# Patient Record
Sex: Male | Born: 1970 | Race: White | Hispanic: No | Marital: Single | Smoking: Current every day smoker
Health system: Southern US, Community
[De-identification: ages and names within clinical notes are randomized; demographics above are authoritative.]

## PROBLEM LIST (undated history)

## (undated) DIAGNOSIS — F1910 Other psychoactive substance abuse, uncomplicated: Secondary | ICD-10-CM

## (undated) DIAGNOSIS — I10 Essential (primary) hypertension: Secondary | ICD-10-CM

## (undated) DIAGNOSIS — E785 Hyperlipidemia, unspecified: Secondary | ICD-10-CM

## (undated) HISTORY — PX: OTHER SURGICAL HISTORY: SHX169

## (undated) HISTORY — DX: Hyperlipidemia, unspecified: E78.5

## (undated) HISTORY — DX: Essential (primary) hypertension: I10

## (undated) HISTORY — DX: Other psychoactive substance abuse, uncomplicated: F19.10

---

## 2013-02-04 ENCOUNTER — Telehealth: Payer: Self-pay | Admitting: Family Medicine

## 2013-02-04 NOTE — Telephone Encounter (Signed)
APPT MADE

## 2013-02-05 ENCOUNTER — Encounter: Payer: Self-pay | Admitting: General Practice

## 2013-02-05 ENCOUNTER — Ambulatory Visit (INDEPENDENT_AMBULATORY_CARE_PROVIDER_SITE_OTHER): Payer: 59 | Admitting: General Practice

## 2013-02-05 VITALS — BP 147/92 | HR 71 | Temp 96.8°F | Ht 70.0 in | Wt 254.0 lb

## 2013-02-05 DIAGNOSIS — J069 Acute upper respiratory infection, unspecified: Secondary | ICD-10-CM

## 2013-02-05 DIAGNOSIS — R062 Wheezing: Secondary | ICD-10-CM

## 2013-02-05 DIAGNOSIS — R0602 Shortness of breath: Secondary | ICD-10-CM

## 2013-02-05 MED ORDER — ALBUTEROL SULFATE HFA 108 (90 BASE) MCG/ACT IN AERS: 2.0000 | INHALATION_SPRAY | Freq: Four times a day (QID) | RESPIRATORY_TRACT | Status: DC | PRN

## 2013-02-05 MED ORDER — AZITHROMYCIN 250 MG PO TABS: ORAL_TABLET | ORAL | Status: DC

## 2013-02-05 NOTE — Patient Instructions (Addendum)

## 2013-02-05 NOTE — Progress Notes (Signed)
  Subjective:    Patient ID: Cody Warner, male    DOB: May 01, 1971, 42 y.o.   MRN: [id number]  HPI Patient presents today with complaints of coughing, congestion, and wheezing. Reports onset of symptoms was three weeks ago. Reports symptoms got better, but then worsened. Reports taking OTC mucinex, nyquil, and delsym with minimal relief.     Review of Systems  Constitutional: Negative for fever and chills.  Respiratory: Positive for cough, shortness of breath and wheezing. Negative for chest tightness.   Cardiovascular: Negative for chest pain and palpitations.  Neurological: Negative for dizziness, weakness and headaches.       Objective:   Physical Exam  Constitutional: He is oriented to person, place, and time. He appears well-developed and well-nourished.  HENT:  Head: Normocephalic and atraumatic.  Right Ear: External ear normal.  Left Ear: External ear normal.  Mouth/Throat: Posterior oropharyngeal erythema present.  Cardiovascular: Normal rate, regular rhythm and normal heart sounds.   Pulmonary/Chest: Effort normal. He has wheezes. He exhibits no tenderness.  Neurological: He is alert and oriented to person, place, and time.  Skin: Skin is warm and dry.  Psychiatric: He has a normal mood and affect.          Assessment & Plan:  1. Acute upper respiratory infections of unspecified site - azithromycin (ZITHROMAX) 250 MG tablet; Take as directed  Dispense: 6 tablet; Refill: 0  2. Wheezing - albuterol (PROVENTIL HFA;VENTOLIN HFA) 108 (90 BASE) MCG/ACT inhaler; Inhale 2 puffs into the lungs every 6 (six) hours as needed for wheezing.  Dispense: 1 Inhaler; Refill: 0  3. Shortness of breath - albuterol (PROVENTIL HFA;VENTOLIN HFA) 108 (90 BASE) MCG/ACT inhaler; Inhale 2 puffs into the lungs every 6 (six) hours as needed for wheezing.  Dispense: 1 Inhaler; Refill: 0 -Increase fluid intake Motrin or tylenol OTC Proper handwashing Patient verbalized understanding Coralie Keens, FNP-C

## 2013-04-08 ENCOUNTER — Ambulatory Visit (INDEPENDENT_AMBULATORY_CARE_PROVIDER_SITE_OTHER): Payer: 59 | Admitting: Family Medicine

## 2013-04-08 ENCOUNTER — Encounter: Payer: Self-pay | Admitting: Family Medicine

## 2013-04-08 VITALS — BP 131/83 | HR 89 | Temp 97.2°F | Ht 70.0 in | Wt 253.0 lb

## 2013-04-08 DIAGNOSIS — G4700 Insomnia, unspecified: Secondary | ICD-10-CM

## 2013-04-08 LAB — POCT CBC
Granulocyte percent: 70.5 % (ref 37–80)
HCT, POC: 47.7 % (ref 43.5–53.7)
Hemoglobin: 15.4 g/dL (ref 14.1–18.1)
Lymph, poc: 2.5 (ref 0.6–3.4)
MCH, POC: 27.5 pg (ref 27–31.2)
MCHC: 32.4 g/dL (ref 31.8–35.4)
MCV: 85.1 fL (ref 80–97)
MPV: 8.2 fL (ref 0–99.8)
POC Granulocyte: 6.9 (ref 2–6.9)
POC LYMPH PERCENT: 25.8 % (ref 10–50)
Platelet Count, POC: 380 K/uL (ref 142–424)
RBC: 5.6 M/uL (ref 4.69–6.13)
RDW, POC: 13.2 %
WBC: 9.8 K/uL (ref 4.6–10.2)

## 2013-04-08 MED ORDER — TRAZODONE HCL 50 MG PO TABS: 25.0000 mg | ORAL_TABLET | Freq: Every evening | ORAL | Status: DC | PRN

## 2013-04-08 NOTE — Addendum Note (Signed)
Addended by: Orma Render F on: 04/08/2013 02:22 PM   Modules accepted: Orders

## 2013-04-08 NOTE — Patient Instructions (Signed)
Smoking Cessation Quitting smoking is important to your health and has many advantages. However, it is not always easy to quit since nicotine is a very addictive drug. Often times, people try 3 times or more before being able to quit. This document explains the best ways for you to prepare to quit smoking. Quitting takes hard work and a lot of effort, but you can do it. ADVANTAGES OF QUITTING SMOKING  You will live longer, feel better, and live better.  Your body will feel the impact of quitting smoking almost immediately.  Within 20 minutes, blood pressure decreases. Your pulse returns to its normal level.  After 8 hours, carbon monoxide levels in the blood return to normal. Your oxygen level increases.  After 24 hours, the chance of having a heart attack starts to decrease. Your breath, hair, and body stop smelling like smoke.  After 48 hours, damaged nerve endings begin to recover. Your sense of taste and smell improve.  After 72 hours, the body is virtually free of nicotine. Your bronchial tubes relax and breathing becomes easier.  After 2 to 12 weeks, lungs can hold more air. Exercise becomes easier and circulation improves.  The risk of having a heart attack, stroke, cancer, or lung disease is greatly reduced.  After 1 year, the risk of coronary heart disease is cut in half.  After 5 years, the risk of stroke falls to the same as a nonsmoker.  After 10 years, the risk of lung cancer is cut in half and the risk of other cancers decreases significantly.  After 15 years, the risk of coronary heart disease drops, usually to the level of a nonsmoker.  If you are pregnant, quitting smoking will improve your chances of having a healthy baby.  The people you live with, especially any children, will be healthier.  You will have extra money to spend on things other than cigarettes. QUESTIONS TO THINK ABOUT BEFORE ATTEMPTING TO QUIT You may want to talk about your answers with your  caregiver.  Why do you want to quit?  If you tried to quit in the past, what helped and what did not?  What will be the most difficult situations for you after you quit? How will you plan to handle them?  Who can help you through the tough times? Your family? Friends? A caregiver?  What pleasures do you get from smoking? What ways can you still get pleasure if you quit? Here are some questions to ask your caregiver:  How can you help me to be successful at quitting?  What medicine do you think would be best for me and how should I take it?  What should I do if I need more help?  What is smoking withdrawal like? How can I get information on withdrawal? GET READY  Set a quit date.  Change your environment by getting rid of all cigarettes, ashtrays, matches, and lighters in your home, car, or work. Do not let people smoke in your home.  Review your past attempts to quit. Think about what worked and what did not. GET SUPPORT AND ENCOURAGEMENT You have a better chance of being successful if you have help. You can get support in many ways.  Tell your family, friends, and co-workers that you are going to quit and need their support. Ask them not to smoke around you.  Get individual, group, or telephone counseling and support. Programs are available at local hospitals and health centers. Call your local health department for   information about programs in your area.  Spiritual beliefs and practices may help some smokers quit.  Download a "quit meter" on your computer to keep track of quit statistics, such as how long you have gone without smoking, cigarettes not smoked, and money saved.  Get a self-help book about quitting smoking and staying off of tobacco. LEARN NEW SKILLS AND BEHAVIORS  Distract yourself from urges to smoke. Talk to someone, go for a walk, or occupy your time with a task.  Change your normal routine. Take a different route to work. Drink tea instead of coffee.  Eat breakfast in a different place.  Reduce your stress. Take a hot bath, exercise, or read a book.  Plan something enjoyable to do every day. Reward yourself for not smoking.  Explore interactive web-based programs that specialize in helping you quit. GET MEDICINE AND USE IT CORRECTLY Medicines can help you stop smoking and decrease the urge to smoke. Combining medicine with the above behavioral methods and support can greatly increase your chances of successfully quitting smoking.  Nicotine replacement therapy helps deliver nicotine to your body without the negative effects and risks of smoking. Nicotine replacement therapy includes nicotine gum, lozenges, inhalers, nasal sprays, and skin patches. Some may be available over-the-counter and others require a prescription.  Antidepressant medicine helps people abstain from smoking, but how this works is unknown. This medicine is available by prescription.  Nicotinic receptor partial agonist medicine simulates the effect of nicotine in your brain. This medicine is available by prescription. Ask your caregiver for advice about which medicines to use and how to use them based on your health history. Your caregiver will tell you what side effects to look out for if you choose to be on a medicine or therapy. Carefully read the information on the package. Do not use any other product containing nicotine while using a nicotine replacement product.  RELAPSE OR DIFFICULT SITUATIONS Most relapses occur within the first 3 months after quitting. Do not be discouraged if you start smoking again. Remember, most people try several times before finally quitting. You may have symptoms of withdrawal because your body is used to nicotine. You may crave cigarettes, be irritable, feel very hungry, cough often, get headaches, or have difficulty concentrating. The withdrawal symptoms are only temporary. They are strongest when you first quit, but they will go away within  10 14 days. To reduce the chances of relapse, try to:  Avoid drinking alcohol. Drinking lowers your chances of successfully quitting.  Reduce the amount of caffeine you consume. Once you quit smoking, the amount of caffeine in your body increases and can give you symptoms, such as a rapid heartbeat, sweating, and anxiety.  Avoid smokers because they can make you want to smoke.  Do not let weight gain distract you. Many smokers will gain weight when they quit, usually less than 10 pounds. Eat a healthy diet and stay active. You can always lose the weight gained after you quit.  Find ways to improve your mood other than smoking. FOR MORE INFORMATION  www.smokefree.gov  Document Released: 04/25/2001 Document Revised: 10/31/2011 Document Reviewed: 08/10/2011 [employer] Patient Information 2014 [employer].  
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
## 2013-04-09 LAB — COMPREHENSIVE METABOLIC PANEL
Albumin/Globulin Ratio: 1.6 (ref 1.1–2.5)
Albumin: 4.3 g/dL (ref 3.5–5.5)
Alkaline Phosphatase: 84 IU/L (ref 39–117)
BUN/Creatinine Ratio: 13 (ref 9–20)
BUN: 10 mg/dL (ref 6–24)
CO2: 25 mmol/L (ref 18–29)
Chloride: 97 mmol/L (ref 97–108)
GFR calc Af Amer: 129 mL/min/{1.73_m2} (ref >59)
GFR calc non Af Amer: 111 mL/min/{1.73_m2} (ref >59)
Glucose: 128 mg/dL — ABNORMAL HIGH (ref 65–99)
Potassium: 3.6 mmol/L (ref 3.5–5.2)
Total Bilirubin: <0.2 mg/dL (ref 0.0–1.2)
Total Protein: 7 g/dL (ref 6.0–8.5)

## 2013-04-09 LAB — TSH: TSH: 2.89 u[IU]/mL (ref 0.450–4.500)

## 2013-04-15 ENCOUNTER — Telehealth: Payer: Self-pay | Admitting: *Deleted

## 2013-04-15 DIAGNOSIS — R739 Hyperglycemia, unspecified: Secondary | ICD-10-CM

## 2013-04-15 NOTE — Telephone Encounter (Signed)
Discussed with patient. He will return for A1C this week.  Order placed.

## 2013-04-15 NOTE — Telephone Encounter (Signed)
Message copied by Gwenith Daily on Tue Apr 15, 2013  3:43 PM ------      Message from: Floydene Flock      Created: Sat Apr 12, 2013  9:19 AM       Labs look good apart from glucose.       Will need follow up A1C.       Please contact pt to set this up.        ------

## 2013-04-19 LAB — PRESCRIPTION ABUSE MONITORING 17P, URINE
Amphetamine Screen, Urine: NEGATIVE ng/mL
Benzodiazepines: NEGATIVE ng/mL
Buprenorphine, Urine: NEGATIVE ng/mL
Cannabinoid: NEGATIVE ng/mL
Carisoprodol/Meprobamate, Ur: NEGATIVE ng/mL
Creatinine, Urine: 43 mg/dL (ref 20.0–300.0)
Methadone: NEGATIVE ng/mL
Opiate: NEGATIVE ng/mL
Oxycodone+Oxymorphone Ur Ql Scn: NEGATIVE ng/mL
Tramadol: NEGATIVE ng/mL
Zolpidem (Ambien), Urine: NEGATIVE ng/mL

## 2013-04-24 ENCOUNTER — Other Ambulatory Visit (INDEPENDENT_AMBULATORY_CARE_PROVIDER_SITE_OTHER): Payer: 59

## 2013-04-24 DIAGNOSIS — R7309 Other abnormal glucose: Secondary | ICD-10-CM

## 2013-04-24 LAB — POCT GLYCOSYLATED HEMOGLOBIN (HGB A1C): Hemoglobin A1C: 5.6

## 2013-04-24 NOTE — Addendum Note (Signed)
Addended by: Orma Render F on: 04/24/2013 11:31 AM   Modules accepted: Orders

## 2013-04-24 NOTE — Progress Notes (Signed)
Patient came in for labs only.

## 2013-04-26 LAB — NMR, LIPOPROFILE
HDL Cholesterol by NMR: 43 mg/dL (ref >=40)
LDL Particle Number: 2172 nmol/L — ABNORMAL HIGH (ref <1000)
LDL Size: 20.8 nm (ref >20.5)
LDLC SERPL CALC-MCNC: 142 mg/dL — ABNORMAL HIGH (ref <100)
Triglycerides by NMR: 269 mg/dL — ABNORMAL HIGH (ref <150)

## 2013-04-29 ENCOUNTER — Telehealth: Payer: Self-pay | Admitting: Family Medicine

## 2013-04-30 NOTE — Telephone Encounter (Signed)
Has an appointment today with dwm

## 2013-05-05 MED ORDER — ROSUVASTATIN CALCIUM 20 MG PO TABS: 20.0000 mg | ORAL_TABLET | Freq: Every day | ORAL | Status: DC

## 2013-05-05 NOTE — Telephone Encounter (Signed)
Discussed lab results and need for start crestor. Patient is willing to do this. Discussed decreasing soda's and other simple carbs in his diet to lower triglycerides. He will contact us if he has any problems with Crestor. F/u in 3 months for rck. Patient stated understanding and agreement to plan.

## 2013-06-16 ENCOUNTER — Ambulatory Visit: Payer: 59 | Admitting: Family Medicine

## 2013-08-06 ENCOUNTER — Encounter (INDEPENDENT_AMBULATORY_CARE_PROVIDER_SITE_OTHER): Payer: Self-pay

## 2013-08-06 ENCOUNTER — Encounter: Payer: Self-pay | Admitting: Family Medicine

## 2013-08-06 ENCOUNTER — Ambulatory Visit (INDEPENDENT_AMBULATORY_CARE_PROVIDER_SITE_OTHER): Payer: 59 | Admitting: Family Medicine

## 2013-08-06 VITALS — BP 126/78 | HR 79 | Temp 96.6°F | Ht 70.0 in | Wt 261.0 lb

## 2013-08-06 DIAGNOSIS — Z7189 Other specified counseling: Secondary | ICD-10-CM

## 2013-08-06 DIAGNOSIS — R635 Abnormal weight gain: Secondary | ICD-10-CM

## 2013-08-06 DIAGNOSIS — Z716 Tobacco abuse counseling: Secondary | ICD-10-CM

## 2013-08-06 MED ORDER — BUPROPION HCL 100 MG PO TABS: ORAL_TABLET | ORAL | Status: DC

## 2013-08-06 NOTE — Progress Notes (Signed)
   Subjective:    Patient ID: Cody CornfieldWilliam R Vowels, male    DOB: 09/29/1970, 43 y.o.   MRN: [id number]  HPI Pt presents today with chief complaint of weight gain  Pt states that he has noticed 40lb weight gain over last year.  Knows that he eats a poor diet.  Has tried quitting smoking and this comes with increased appetite and worsening weight gain.  No lethargy.  Mood stable.  No riorhx/o thyroid disease.  Would like some treatment options for weight loss and smoking cessation.    Review of Systems  All other systems reviewed and are negative.       Objective:   Physical Exam  Constitutional: He appears well-developed.  Mildly obese    HENT:  Head: Normocephalic and atraumatic.  Eyes: Conjunctivae are normal. Pupils are equal, round, and reactive to light.  Neck: Normal range of motion. Neck supple.  Cardiovascular: Normal rate and regular rhythm.   Pulmonary/Chest: Effort normal and breath sounds normal.  Abdominal: Soft.  Musculoskeletal: Normal range of motion.  Neurological: He is alert.  Skin: Skin is warm.          Assessment & Plan:  Weight gain - Plan: TSH, buPROPion (WELLBUTRIN) 100 MG tablet  Encounter for smoking cessation counseling - Plan: buPROPion (WELLBUTRIN) 100 MG tablet  Check TSH  Start on wellbutrin. Should be able to help augment smoking cessation and appetite.  Discussed importance of diet and exercise.  Start low fat, high fruit and vegetable diet.  Discussed smoking options.  Nutrition consult.  Plan for follow up in 2-3 months for general recheck.  Otherwise follow up as needed.

## 2013-08-07 LAB — TSH: TSH: 1.39 u[IU]/mL (ref 0.450–4.500)

## 2013-10-24 ENCOUNTER — Encounter: Payer: Self-pay | Admitting: Family Medicine

## 2013-10-24 ENCOUNTER — Ambulatory Visit (INDEPENDENT_AMBULATORY_CARE_PROVIDER_SITE_OTHER): Payer: 59 | Admitting: Family Medicine

## 2013-10-24 VITALS — BP 139/94 | HR 85 | Temp 97.5°F | Ht 70.0 in | Wt 247.0 lb

## 2013-10-24 DIAGNOSIS — L0390 Cellulitis, unspecified: Secondary | ICD-10-CM

## 2013-10-24 DIAGNOSIS — L0291 Cutaneous abscess, unspecified: Secondary | ICD-10-CM

## 2013-10-24 MED ORDER — HYDROCODONE-ACETAMINOPHEN 5-325 MG PO TABS: 1.0000 | ORAL_TABLET | Freq: Four times a day (QID) | ORAL | Status: DC | PRN

## 2013-10-24 MED ORDER — CEFTRIAXONE SODIUM 1 G IJ SOLR
1.0000 g | INTRAMUSCULAR | Status: AC
  Administered 2013-10-24: 1 g via INTRAMUSCULAR

## 2013-10-24 MED ORDER — AMOXICILLIN-POT CLAVULANATE 875-125 MG PO TABS: 1.0000 | ORAL_TABLET | Freq: Two times a day (BID) | ORAL | Status: DC

## 2013-10-24 NOTE — Progress Notes (Signed)
   Subjective:    Patient ID: Cody Warner, male    DOB: 05/11/1971, 43 y.o.   MRN: [id number]  HPI This 43 y.o. male presents for evaluation of pain and discomfort lower lip.  He states he cannot sleep.  He had a white spot on the lip and he squeezed it and then his lip became swollen and painful.   Review of Systems C/o severe pain in lower lip No chest pain, SOB, HA, dizziness, vision change, N/V, diarrhea, constipation, dysuria, urinary urgency or frequency, myalgias, arthralgias or rash.     Objective:   Physical Exam  Vital signs noted  Well developed well nourished male.  HEENT - Head atraumatic Normocephalic                Eyes - PERRLA, Conjuctiva - clear Sclera- Clear EOMI                Ears - EAC's Wnl TM's Wnl Gross Hearing WNL                Mouth - lower lip with edema, erythema, tenderness. Respiratory - Lungs CTA bilateral Cardiac - RRR S1 and S2 without murmur       Assessment & Plan:  Cellulitis - Plan: cefTRIAXone (ROCEPHIN) injection 1 g, amoxicillin-clavulanate (AUGMENTIN) 875-125 MG per tablet, HYDROcodone-acetaminophen (NORCO) 5-325 MG per tablet  Deatra CanterWilliam J Oxford FNP

## 2013-12-09 ENCOUNTER — Ambulatory Visit (INDEPENDENT_AMBULATORY_CARE_PROVIDER_SITE_OTHER): Payer: 59 | Admitting: Family Medicine

## 2013-12-09 ENCOUNTER — Ambulatory Visit (INDEPENDENT_AMBULATORY_CARE_PROVIDER_SITE_OTHER): Payer: 59

## 2013-12-09 ENCOUNTER — Telehealth: Payer: Self-pay | Admitting: Family Medicine

## 2013-12-09 VITALS — BP 144/96 | HR 77 | Temp 97.0°F | Ht 70.0 in | Wt 247.0 lb

## 2013-12-09 DIAGNOSIS — S329XXS Fracture of unspecified parts of lumbosacral spine and pelvis, sequela: Secondary | ICD-10-CM

## 2013-12-09 DIAGNOSIS — M25559 Pain in unspecified hip: Secondary | ICD-10-CM

## 2013-12-09 DIAGNOSIS — IMO0002 Reserved for concepts with insufficient information to code with codable children: Secondary | ICD-10-CM

## 2013-12-09 MED ORDER — HYDROCODONE-ACETAMINOPHEN 5-325 MG PO TABS: 1.0000 | ORAL_TABLET | Freq: Four times a day (QID) | ORAL | Status: DC | PRN

## 2013-12-09 MED ORDER — NAPROXEN 500 MG PO TABS: 500.0000 mg | ORAL_TABLET | Freq: Two times a day (BID) | ORAL | Status: DC
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
## 2013-12-09 NOTE — Telephone Encounter (Signed)
appt made

## 2014-03-24 ENCOUNTER — Encounter: Payer: Self-pay | Admitting: Family Medicine

## 2014-03-24 ENCOUNTER — Ambulatory Visit (INDEPENDENT_AMBULATORY_CARE_PROVIDER_SITE_OTHER): Payer: 59

## 2014-03-24 ENCOUNTER — Ambulatory Visit (INDEPENDENT_AMBULATORY_CARE_PROVIDER_SITE_OTHER): Payer: 59 | Admitting: Family Medicine

## 2014-03-24 VITALS — BP 133/85 | HR 73 | Temp 96.7°F | Ht 70.0 in | Wt 248.8 lb

## 2014-03-24 DIAGNOSIS — R059 Cough, unspecified: Secondary | ICD-10-CM

## 2014-03-24 DIAGNOSIS — R05 Cough: Secondary | ICD-10-CM

## 2014-03-24 DIAGNOSIS — S329XXS Fracture of unspecified parts of lumbosacral spine and pelvis, sequela: Secondary | ICD-10-CM

## 2014-03-24 DIAGNOSIS — R0789 Other chest pain: Secondary | ICD-10-CM

## 2014-03-24 DIAGNOSIS — J206 Acute bronchitis due to rhinovirus: Secondary | ICD-10-CM

## 2014-03-24 MED ORDER — HYDROCODONE-ACETAMINOPHEN 5-325 MG PO TABS: 1.0000 | ORAL_TABLET | Freq: Four times a day (QID) | ORAL | Status: DC | PRN

## 2014-03-24 MED ORDER — METHYLPREDNISOLONE (PAK) 4 MG PO TABS: ORAL_TABLET | ORAL | Status: DC

## 2014-03-24 MED ORDER — LEVOFLOXACIN 500 MG PO TABS: 500.0000 mg | ORAL_TABLET | Freq: Every day | ORAL | Status: DC

## 2014-03-24 NOTE — Progress Notes (Signed)
   Subjective:    Patient ID: Cody Warner, male    DOB: 12/25/1970, 43 y.o.   MRN: [id number]  HPI Patient is c/o sob, feels like pulled muscle in chest.  He gets short of breath at night and c/o DOE recently.  He has been having episodes of PND.  He c/o chest tight in chest steady for 5 days.  He states he has pain and discomfort in his right chest.  Review of Systems  Constitutional: Positive for activity change and fatigue. Negative for fever.  HENT: Negative for ear pain.   Eyes: Negative for discharge.  Respiratory: Positive for chest tightness and shortness of breath. Negative for cough.   Cardiovascular: Negative for chest pain.  Gastrointestinal: Negative for abdominal distention.  Endocrine: Negative for polyuria.  Genitourinary: Negative for difficulty urinating.  Musculoskeletal: Negative for gait problem and neck pain.  Skin: Negative for color change and rash.  Neurological: Negative for speech difficulty and headaches.  Psychiatric/Behavioral: Negative for agitation.       Objective:    BP 133/85 mmHg  Pulse 73  Temp(Src) 96.7 F (35.9 C) (Oral)  Ht 5\' 10"  (1.778 m)  Wt 248 lb 12.8 oz (112.855 kg)  BMI 35.70 kg/m2 Physical Exam  Constitutional: He is oriented to person, place, and time. He appears well-developed and well-nourished.  HENT:  Head: Normocephalic and atraumatic.  Mouth/Throat: Oropharynx is clear and moist.  Eyes: Pupils are equal, round, and reactive to light.  Neck: Normal range of motion. Neck supple.  Cardiovascular: Normal rate and regular rhythm.   No murmur heard. Pulmonary/Chest: Effort normal and breath sounds normal.  Diminished breath sounds throughout  Abdominal: Soft. Bowel sounds are normal. There is no tenderness.  Neurological: He is alert and oriented to person, place, and time.  Skin: Skin is warm and dry.  Psychiatric: He has a normal mood and affect.    cxr - Possible infiltrate right lung Prelimnary reading by  Angeline SlimWilliam Taber Sweetser,FNP  Oxygen Saturation - 95%       Assessment & Plan:     ICD-9-CM ICD-10-CM   1. Cough 786.2 R05 DG Chest 2 View     methylPREDNIsolone (MEDROL DOSPACK) 4 MG tablet  2. Other chest pain 786.59 R07.89 EKG 12-Lead     methylPREDNIsolone (MEDROL DOSPACK) 4 MG tablet  3. Pelvic fracture, sequela 905.1 S32.9XXS HYDROcodone-acetaminophen (NORCO) 5-325 MG per tablet  4. Acute bronchitis due to Rhinovirus 466.0 J20.6 methylPREDNIsolone (MEDROL DOSPACK) 4 MG tablet   079.3       No Follow-up on file.  Deatra CanterWilliam J Styles Fambro FNP

## 2014-08-01 ENCOUNTER — Ambulatory Visit (INDEPENDENT_AMBULATORY_CARE_PROVIDER_SITE_OTHER): Payer: 59 | Admitting: Family Medicine

## 2014-08-01 VITALS — BP 147/95 | HR 90 | Temp 96.9°F | Ht 70.0 in | Wt 258.0 lb

## 2014-08-01 DIAGNOSIS — G2581 Restless legs syndrome: Secondary | ICD-10-CM | POA: Diagnosis not present

## 2014-08-01 DIAGNOSIS — G4700 Insomnia, unspecified: Secondary | ICD-10-CM | POA: Diagnosis not present

## 2014-08-01 MED ORDER — TRAZODONE HCL 150 MG PO TABS: 150.0000 mg | ORAL_TABLET | Freq: Every day | ORAL | Status: DC
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
## 2014-08-03 ENCOUNTER — Telehealth: Payer: Self-pay | Admitting: Family Medicine

## 2014-08-03 DIAGNOSIS — I10 Essential (primary) hypertension: Secondary | ICD-10-CM

## 2014-08-03 DIAGNOSIS — E785 Hyperlipidemia, unspecified: Secondary | ICD-10-CM

## 2014-08-03 NOTE — Telephone Encounter (Signed)
Labs ordered. Note okay.

## 2014-08-03 NOTE — Telephone Encounter (Signed)
Note to front Labs entered

## 2014-08-11 ENCOUNTER — Other Ambulatory Visit: Payer: 59

## 2014-08-12 ENCOUNTER — Telehealth: Payer: Self-pay | Admitting: Physician Assistant

## 2014-08-12 ENCOUNTER — Ambulatory Visit (INDEPENDENT_AMBULATORY_CARE_PROVIDER_SITE_OTHER): Payer: 59 | Admitting: Family Medicine

## 2014-08-12 ENCOUNTER — Encounter: Payer: Self-pay | Admitting: Family Medicine

## 2014-08-12 VITALS — BP 153/91 | HR 85 | Temp 97.4°F | Ht 70.0 in | Wt 255.8 lb

## 2014-08-12 DIAGNOSIS — R200 Anesthesia of skin: Secondary | ICD-10-CM | POA: Diagnosis not present

## 2014-08-12 MED ORDER — TEMAZEPAM 7.5 MG PO CAPS: 7.5000 mg | ORAL_CAPSULE | Freq: Every evening | ORAL | Status: AC | PRN

## 2014-08-12 MED ORDER — TEMAZEPAM 7.5 MG PO CAPS: 7.5000 mg | ORAL_CAPSULE | Freq: Every evening | ORAL | Status: DC | PRN

## 2014-08-12 NOTE — Telephone Encounter (Signed)
Pt given appt with Dr.Stacks this afternoon at 5:25.

## 2014-08-12 NOTE — Progress Notes (Signed)
Subjective:  Patient ID: Cody Warner, male    DOB: [birth year]  Age: 44 y.o. MRN: [id number]  CC: leg numbness   HPI MAMORU TAKESHITA presents for left leg numbness onset last night.Not sleeping with trazodone. LAsted off and on from 11:30 pm for about 2 hours. Was gone when he awoke. First occurrence. No previous symptoms in the leg including pain numbness weakness. No focal lateralizing symptoms noted. His risk factors for stroke to include hypertension and hyperlipidemia. He does not take an aspirin daily.  History Kaan has a past medical history of Hyperlipidemia; Hypertension; and Substance abuse.   He has past surgical history that includes BROKEN PELVIC BONE and Left collar bone broken (Left).   His family history includes Alcohol abuse in his father; Cancer in his brother and mother.He reports that he has been smoking Cigarettes.  He has been smoking about 1.00 pack per day. He has never used smokeless tobacco. He reports that he does not drink alcohol or use illicit drugs.  Current Outpatient Prescriptions on File Prior to Visit  Medication Sig Dispense Refill  . lisinopril-hydrochlorothiazide (ZESTORETIC) 20-12.5 MG per tablet Take 1 tablet by mouth.    . simvastatin (ZOCOR) 20 MG tablet Take 20 mg by mouth.     No current facility-administered medications on file prior to visit.    ROS Review of Systems  Constitutional: Negative for fever, chills, diaphoresis and unexpected weight change.  HENT: Negative for congestion, hearing loss, rhinorrhea, sore throat and trouble swallowing.   Respiratory: Negative for cough, chest tightness, shortness of breath and wheezing.   Gastrointestinal: Negative for nausea, vomiting, abdominal pain, diarrhea, constipation and abdominal distention.  Endocrine: Negative for cold intolerance and heat intolerance.  Genitourinary: Negative for dysuria, hematuria and flank pain.  Musculoskeletal: Negative for joint swelling and arthralgias.   Skin: Negative for rash.  Neurological: Negative for dizziness and headaches.  Psychiatric/Behavioral: Negative for dysphoric mood, decreased concentration and agitation. The patient is not nervous/anxious.     Objective:  BP 153/91 mmHg  Pulse 85  Temp(Src) 97.4 F (36.3 C) (Oral)  Ht  (1.778 m)  Wt 255 lb 12.8 oz (116.03 kg)  BMI 36.70 kg/m2  BP Readings from Last 3 Encounters:  08/12/14 153/91  08/01/14 147/95  03/24/14 133/85    Wt Readings from Last 3 Encounters:  08/12/14 255 lb 12.8 oz (116.03 kg)  08/01/14 258 lb (117.028 kg)  03/24/14 248 lb 12.8 oz (112.855 kg)     Physical Exam  Constitutional: He is oriented to person, place, and time. He appears well-developed and well-nourished. No distress.  HENT:  Head: Normocephalic and atraumatic.  Right Ear: External ear normal.  Left Ear: External ear normal.  Nose: Nose normal.  Mouth/Throat: Oropharynx is clear and moist.  Eyes: Conjunctivae and EOM are normal. Pupils are equal, round, and reactive to light.  Neck: Normal range of motion. Neck supple. No thyromegaly present.  Cardiovascular: Normal rate, regular rhythm and normal heart sounds.   No murmur heard. Pulmonary/Chest: Effort normal and breath sounds normal. No respiratory distress. He has no wheezes. He has no rales.  Abdominal: Soft. Bowel sounds are normal. He exhibits no distension. There is no tenderness.  Lymphadenopathy:    He has no cervical adenopathy.  Neurological: He is alert and oriented to person, place, and time. He has normal reflexes.  Skin: Skin is warm and dry.  Psychiatric: He has a normal mood and affect. His behavior is normal. Judgment and  thought content normal.    Lab Results  Component Value Date   HGBA1C 5.6 04/24/2013    Lab Results  Component Value Date   WBC 9.8 04/08/2013   HGB 15.4 04/08/2013   HCT 47.7 04/08/2013   GLUCOSE 128* 04/08/2013   CHOL 239* 04/24/2013   TRIG 269* 04/24/2013   HDL 43  04/24/2013   LDLCALC 142* 04/24/2013   ALT 32 04/08/2013   AST 21 04/08/2013   NA 138 04/08/2013   K 3.6 04/08/2013   CL 97 04/08/2013   CREATININE 0.78 04/08/2013   BUN 10 04/08/2013   CO2 25 04/08/2013   TSH 1.390 08/06/2013   HGBA1C 5.6 04/24/2013    Patient was never admitted.  Assessment & Plan:   [name] was seen today for leg numbness.  Diagnoses and all orders for this visit:  Numbness  Other orders -     Cancel: POCT UA - Microscopic Only -     Cancel: POCT urinalysis dipstick -     Discontinue: temazepam (RESTORIL) 7.5 MG capsule; Take 1 capsule (7.5 mg total) by mouth at bedtime as needed for sleep. -     temazepam (RESTORIL) 7.5 MG capsule; Take 1 capsule (7.5 mg total) by mouth at bedtime as needed for sleep.   I have discontinued Mr. Maertens's traZODone and temazepam. I am also having him start on temazepam. Additionally, I am having him maintain his simvastatin and lisinopril-hydrochlorothiazide.  Meds ordered this encounter  Medications  . DISCONTD: temazepam (RESTORIL) 7.5 MG capsule    Sig: Take 1 capsule (7.5 mg total) by mouth at bedtime as needed for sleep.    Dispense:  30 capsule    Refill:  0  . temazepam (RESTORIL) 7.5 MG capsule    Sig: Take 1 capsule (7.5 mg total) by mouth at bedtime as needed for sleep.    Dispense:  30 capsule    Refill:  0     Follow-up: Return in about 2 weeks (around 08/26/2014) for CPE.  Mechele ClaudeWarren Shawnmichael Parenteau, M.D.

## 2014-08-17 ENCOUNTER — Ambulatory Visit: Payer: 59 | Admitting: Physician Assistant

## 2014-08-17 ENCOUNTER — Encounter: Payer: Self-pay | Admitting: *Deleted

## 2014-08-19 ENCOUNTER — Ambulatory Visit: Payer: 59 | Admitting: Physician Assistant

## 2014-08-31 ENCOUNTER — Encounter: Payer: 59 | Admitting: Family Medicine

## 2014-09-03 ENCOUNTER — Encounter: Payer: 59 | Admitting: Family Medicine

## 2014-11-06 ENCOUNTER — Ambulatory Visit: Payer: 59 | Admitting: Family Medicine

## 2014-11-09 ENCOUNTER — Encounter: Payer: Self-pay | Admitting: Family Medicine

## 2015-03-18 IMAGING — CR DG CHEST 2V
2 series · 2 of 2 positions shown · non-contrast
Comparison: None.

CLINICAL DATA: Cough, congestion

EXAM:
CHEST  2 VIEW

[view not recorded (1 of 2)]
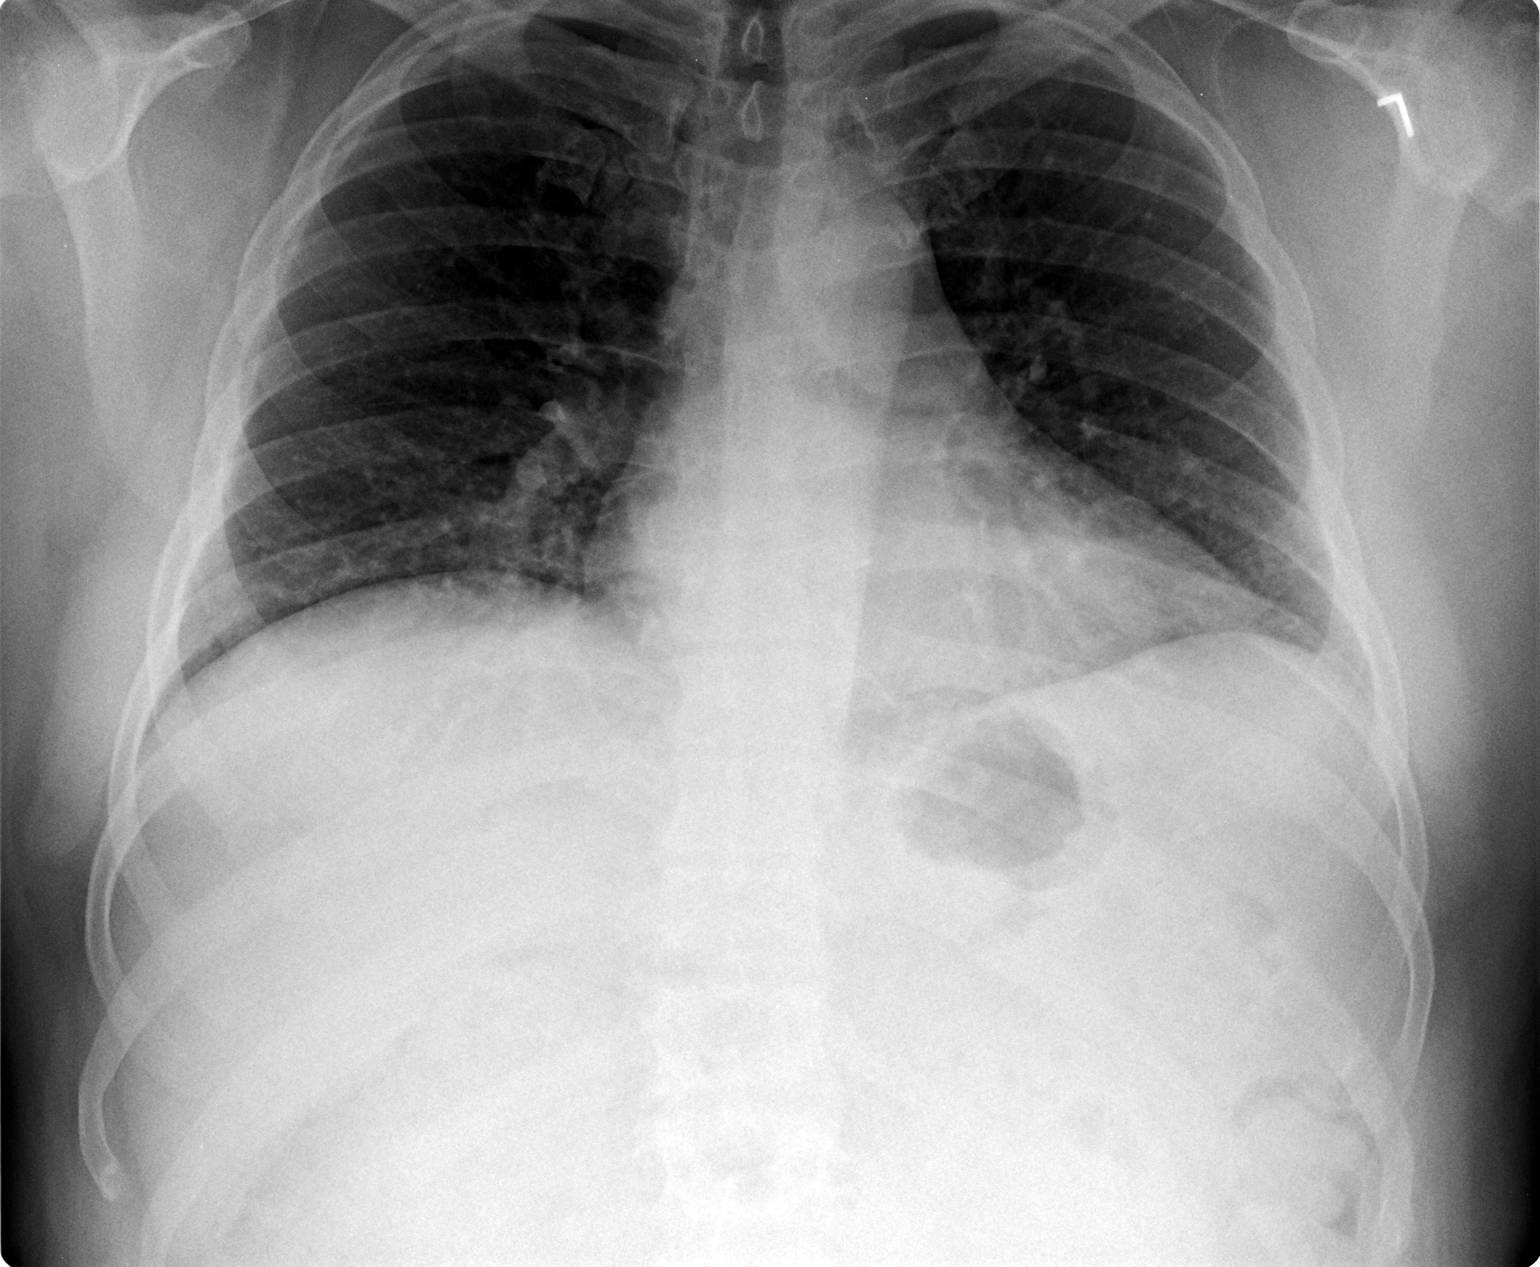

[view not recorded (2 of 2)]
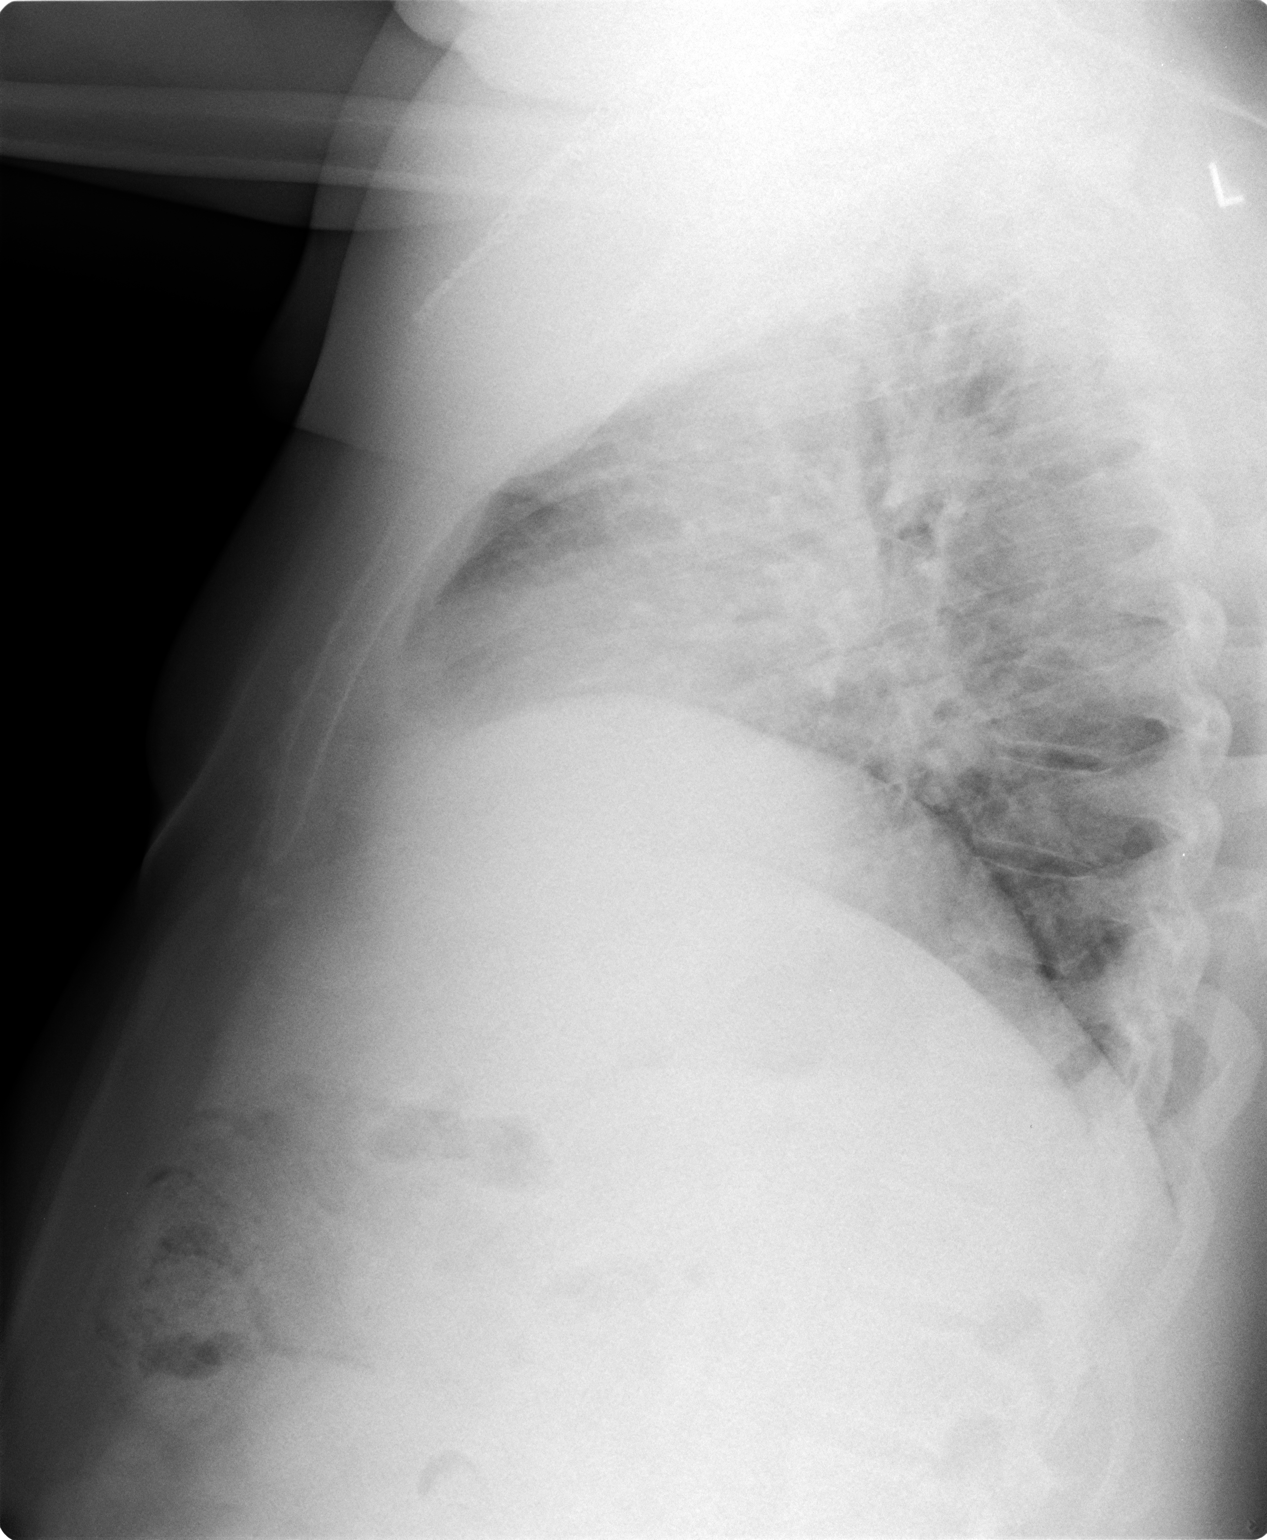

[2 of 2 positions shown; findings below may reference images not displayed]

FINDINGS: The heart size and mediastinal contours are within normal limits.
Both lungs are clear. The visualized skeletal structures are
unremarkable.
IMPRESSION: No active cardiopulmonary disease.
